# Patient Record
Sex: Female | Born: 1985 | Race: White | Hispanic: Yes | Marital: Single | Smoking: Former smoker
Health system: Southern US, Community
[De-identification: ages and names within clinical notes are randomized; demographics above are authoritative.]

## PROBLEM LIST (undated history)

## (undated) DIAGNOSIS — R519 Headache, unspecified: Secondary | ICD-10-CM

## (undated) DIAGNOSIS — Z227 Latent tuberculosis: Secondary | ICD-10-CM

## (undated) DIAGNOSIS — R635 Abnormal weight gain: Secondary | ICD-10-CM

## (undated) DIAGNOSIS — N63 Unspecified lump in unspecified breast: Secondary | ICD-10-CM

## (undated) DIAGNOSIS — Z9289 Personal history of other medical treatment: Secondary | ICD-10-CM

## (undated) DIAGNOSIS — D649 Anemia, unspecified: Secondary | ICD-10-CM

## (undated) DIAGNOSIS — Z862 Personal history of diseases of the blood and blood-forming organs and certain disorders involving the immune mechanism: Secondary | ICD-10-CM

## (undated) HISTORY — DX: Headache, unspecified: R51.9

## (undated) HISTORY — PX: OTHER SURGICAL HISTORY: SHX169

## (undated) HISTORY — DX: Abnormal weight gain: R63.5

## (undated) HISTORY — DX: Personal history of diseases of the blood and blood-forming organs and certain disorders involving the immune mechanism: Z86.2

## (undated) HISTORY — DX: Unspecified lump in unspecified breast: N63.0

## (undated) HISTORY — DX: Latent tuberculosis: Z22.7

## (undated) HISTORY — DX: Personal history of other medical treatment: Z92.89

## (undated) HISTORY — DX: Anemia, unspecified: D64.9

---

## 2013-01-09 ENCOUNTER — Ambulatory Visit: Payer: Self-pay | Admitting: Family Medicine

## 2013-01-09 DIAGNOSIS — R7611 Nonspecific reaction to tuberculin skin test without active tuberculosis: Secondary | ICD-10-CM | POA: Insufficient documentation

## 2013-01-13 ENCOUNTER — Encounter: Payer: Self-pay | Admitting: Obstetrics and Gynecology

## 2013-04-30 ENCOUNTER — Observation Stay: Payer: Self-pay

## 2013-05-07 ENCOUNTER — Observation Stay: Payer: Self-pay

## 2013-05-08 ENCOUNTER — Encounter: Payer: Self-pay | Admitting: Obstetrics and Gynecology

## 2013-05-08 LAB — PROTEIN / CREATININE RATIO, URINE: Protein, Random Urine: 18 mg/dL — ABNORMAL HIGH (ref 0–12)

## 2013-05-09 ENCOUNTER — Inpatient Hospital Stay: Payer: Self-pay | Admitting: Obstetrics and Gynecology

## 2013-05-09 LAB — PROTEIN / CREATININE RATIO, URINE
Creatinine, Urine: 54.4 mg/dL
Protein, Random Urine: 39 mg/dL — ABNORMAL HIGH
Protein/Creat. Ratio: 717 mg/g{creat} — ABNORMAL HIGH

## 2013-05-09 LAB — PIH PROFILE
Anion Gap: 6 — ABNORMAL LOW (ref 7–16)
BUN: 11 mg/dL (ref 7–18)
Calcium, Total: 9.1 mg/dL (ref 8.5–10.1)
Calcium, Total: 9.6 mg/dL (ref 8.5–10.1)
Chloride: 107 mmol/L (ref 98–107)
Chloride: 107 mmol/L (ref 98–107)
EGFR (African American): >60
EGFR (African American): >60
Glucose: 81 mg/dL (ref 65–99)
HCT: 34.7 % — ABNORMAL LOW (ref 35.0–47.0)
HGB: 11.5 g/dL — ABNORMAL LOW (ref 12.0–16.0)
MCH: 26.2 pg (ref 26.0–34.0)
MCH: 26.4 pg (ref 26.0–34.0)
MCV: 79 fL — ABNORMAL LOW (ref 80–100)
Potassium: 4.6 mmol/L (ref 3.5–5.1)
Potassium: 4.6 mmol/L (ref 3.5–5.1)
RDW: 16.7 % — ABNORMAL HIGH (ref 11.5–14.5)
RDW: 17.3 % — ABNORMAL HIGH (ref 11.5–14.5)
Sodium: 136 mmol/L (ref 136–145)
Uric Acid: 8.6 mg/dL — ABNORMAL HIGH (ref 2.6–6.0)
Uric Acid: 8.6 mg/dL — ABNORMAL HIGH (ref 2.6–6.0)
WBC: 17.7 10*3/uL — ABNORMAL HIGH (ref 3.6–11.0)
WBC: 31 10*3/uL — ABNORMAL HIGH (ref 3.6–11.0)

## 2013-05-09 LAB — MAGNESIUM: Magnesium: 8.8 mg/dL — ABNORMAL HIGH

## 2013-05-10 LAB — CBC WITH DIFFERENTIAL/PLATELET
Basophil #: 0 10*3/uL (ref 0.0–0.1)
Basophil %: 0.2 %
Eosinophil #: 0.1 10*3/uL (ref 0.0–0.7)
Eosinophil %: 0.4 %
HCT: 23.3 % — ABNORMAL LOW (ref 35.0–47.0)
Lymphocyte #: 3.5 10*3/uL (ref 1.0–3.6)
Lymphocyte %: 15.2 %
MCV: 80 fL (ref 80–100)
Monocyte %: 6.3 %
Neutrophil #: 18.1 10*3/uL — ABNORMAL HIGH (ref 1.4–6.5)
Neutrophil %: 77.9 %
RDW: 17 % — ABNORMAL HIGH (ref 11.5–14.5)
WBC: 23.3 10*3/uL — ABNORMAL HIGH (ref 3.6–11.0)

## 2013-05-10 LAB — HEMOGLOBIN: HGB: 7.6 g/dL — ABNORMAL LOW (ref 12.0–16.0)

## 2013-05-11 LAB — CBC WITH DIFFERENTIAL/PLATELET
HCT: 26.8 % — ABNORMAL LOW (ref 35.0–47.0)
Lymphocytes: 16 %
MCH: 26.8 pg (ref 26.0–34.0)
MCHC: 33.9 g/dL (ref 32.0–36.0)
MCV: 79 fL — ABNORMAL LOW (ref 80–100)
NRBC/100 WBC: 1 /
Platelet: 116 10*3/uL — ABNORMAL LOW (ref 150–440)
RBC: 3.39 10*6/uL — ABNORMAL LOW (ref 3.80–5.20)
Variant Lymphocyte - H1-Rlymph: 1 %
WBC: 19.8 10*3/uL — ABNORMAL HIGH (ref 3.6–11.0)

## 2014-12-25 NOTE — Consult Note (Signed)
Referral Information:  Reason for Referral History of IUFD at "8 months" due to preeclampsia in 2005 in [place], currently 37+ weeks pregnant.  Presents for recommendations.   Referring Physician [facility]   Prenatal Hx Complicated only by UTIs and positive PPD with negative CXR.  Baseline preeclampsia labs were normal.  Patient has had an ultrasound and had genetic counseling this pregnancy, and has been on a baby ASA.   Past Obstetrical Hx 2005 NVD of stillborn female, 7#, complicated by preeclampsia and club foot in [place] (different FOB).  She presented to the hospital with complaints of contractions and was told that the baby had died; she was subsequently induced.  Pregnancy was otherwise uncomplicated per patient although she was told to "watch her salt intake".  Postpartum course was also uncomplicated. 2014 Current   Home Medications: Medication Instructions Status  Prenatal Multivitamins Prenatal Multivitamins oral tablet 1 tab(s) orally once a day Active  Aspir 81 81 mg oral tablet 1 tab(s) orally once a day Active   Allergies:   No Known Allergies:   Vital Signs/Notes:  Nursing Vital Signs:  **Vital Signs.:   04-Sep-14 14:16  Vital Signs Type Routine  Temperature Temperature (F) 98.7  Celsius 37  Temperature Source oral  Pulse Pulse 103  Respirations Respirations 16  Systolic BP Systolic BP 355  Diastolic BP (mmHg) Diastolic BP (mmHg) 66  Mean BP 93  BP Source  if not from Vital Sign Device non-invasive    15:00  Vital Signs Type Recheck  Pulse Pulse 87  Systolic BP Systolic BP 732  Diastolic BP (mmHg) Diastolic BP (mmHg) 77  Mean BP 94  BP Source  if not from Vital Sign Device non-invasive   Perinatal Consult:  LMP 18-Aug-2012   Past Medical History cont'd UTI (treated with Keflext this pregnancy), negative test of cure + PPD with negative CXR - planning on postpartum medical therapy Sickle Cell trait - had genetic counseling;  per notes, FOB was tested and is negative (per patient)   PSurg Hx None   FHx Mother with diabetes, paternal first cousin with isolated cleft lip and palate   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     [place]:    04-Sep-14 14:34, MFM OB US, Follow Up, Per Fetus  MFM OB US, Follow Up, Per Fetus   Indication: Growth, H/O IUFD, H/O preeclampsia.   ____________________________________________________________________________  History: Age: 29 years.  ____________________________________________________________________________  Dating:  LMP:08/18/2012     EDC:     05/25/2013     GA by LMP:     [redacted]w[redacted]d  Earlier Assessment on:     01/13/2013     EDC:     05/18/2013     GA by earlier  assessment:     [redacted]w[redacted]d  Current Scan on:     05/08/2013     EDC:     05/21/2013     GA by current scan:      [redacted]w[redacted]d  Best Overall Assessment:     05/08/2013     EDC:     05/25/2013     Assessed GA:      [redacted]w[redacted]d  The calculation of the gestational age by current scan was based  on BPD, HC, AC,  FL and HUM.  The Best Overall Assessment is based on the LMP.  ____________________________________________________________________________  Anatomy Scan:  Singleton gestation.  Biometry:  BPD     92.5     mm      -     [redacted]w[redacted]d     ([redacted]w[redacted]d to [redacted]w[redacted]d)  HC     334.0     mm      -     [redacted]w[redacted]d     ([redacted]w[redacted]d to [redacted]w[redacted]d)  AC     334.1   mm      -     [redacted]w[redacted]d     ([redacted]w[redacted]d to [redacted]w[redacted]d)  FL     73.3     mm      -     [redacted]w[redacted]d     ([redacted]w[redacted]d to [redacted]w[redacted]d)  HUM     65.8     mm      -     [redacted]w[redacted]d  EFW (lbs/oz)     7 lbs     2 ozs  EFW (g)     3225     g     57th%     Fetal heart activity: present. Fetal heart rate: 143 bpm.   Fetal presentation: cephalic.   Cord: visualized previously.   Placenta: posterior.  No previa documented previously.     Fetal Anatomy:  Visualized with normal appearance: gastrointestinal tract, kidneys,  bladder.    Brain: visualized previously.   Face: visualized previously.   Spine: visualized previously  Heart: Suboptimally visualized.   Abdominal Wall: visualized previously.   Extremities: visualized previously.    ____________________________________________________________________________  Fetal Wellbeing Assessment:  Amniotic fluid: normal. AFI: 11.9 cm. MVP: 36.4 cm. Q1: 5.3 cm. Q3: 29.3 cm. Q4:  36.4 cm.   Non Stress Test: Fetal heart rate: 143 bpm.   Biophysical Profile: Fetal body movements: normal (2), Fetaltone: normal (2),  Fetal breathing movements: normal (2), Amniotic fluid volume: normal (2). Score  8 / 8.     ____________________________________________________________________________  Report Summary:  Impression: Single intrauterine pregnancy with an estimated gestational age of  [redacted] weeks 4 day(s).  Dating assigned by LMP concordant with ultrasound performed  at [facility] on 01/13/2013; measurements at that exam reported  as 22 weeks 1 days.  Follow up ultrasound performed to assess growth.    Due to fetal position, views of the heart were suboptimal but have been  previously visualized.  Other fetal anatomy visualized appears normal or has  been documented previously.    Appropriate interval growth.    BPP 8/8. CODING DESCRIPTION:  History of preeclampsia.  History of IUFD.    Recommendations:     Thank you for allowing [place] to participate in her care.  Electronically signed by:  Wynona Neat, MD   Impression/Recommendations:  Impression 29yo G2P1000 at with history of stillbirth ? due to preeclampsia at "8 months".  BP initially elevated (140's/60's) but was 128/77 on repeat.  Currently without signs or symptoms of preeclampsia.  Has had HAs but they resolve on their own and she describes them as mild.  No persistent  swelling and no RUQ pain or visual changes.   Recommendations 1.  Patient already getting 2x weekly NSTs at the birthing center.  Continue until delivery. 2.  Weekly visits to monitor BPs - discussed signs and symptoms of preeclampsia as well as counseled on kick counts. 3.  Consider induction at 39-40 weeks unless clinically indicated  earlier. 4.  Postpartum treatment for TB. 5.  P/C ratio ordered today as I didn't see this in any of her labs (Riley labs done earlier included cbc and LFTs which were normal).    Total Time Spent with Patient 15 minutes   >50% of visit spent in couseling/coordination of care yes   Office Use Only 99241  Level 1 (63min) NEW office consult prob focused   Coding Description: MATERNAL CONDITIONS/HISTORY INDICATION(S).   Previous stillborn or neonatal death - Poor Reproductive History.  Electronic Signatures: Wynona Neat (MD)  (Signed 04-Sep-14 15:52)  Authored: Referral, Home Medications, Allergies, Vital Signs/Notes, Consult, Exam, Radiology, Impression, Billing, Coding Description   Last Updated: 04-Sep-14 15:52 by Wynona Neat (MD)

## 2015-01-12 NOTE — H&P (Signed)
L&D Evaluation:  History:  HPI 29 yo G2P0010 with LMP of 08/18/12 & EDD of 05/25/13 with [place] at [facility] here for NST due to hx of IUFD due to pre-ecclampsia in [place] with at bedtime of sickle cell trait, +PPD with neg chest xray, [facility] of cleft lip, lupus, IUFD with a 2nd cousin. Pt is on ASA 81 mg daily.   Presents with NST   Patient's Medical History IUFD hx, +PPD with neg xray   Patient's Surgical History none   Medications Pre Natal Vitamins   Allergies NKDA   Social History none   Family History Non-Contributory   ROS:  ROS All systems were reviewed.  HEENT, CNS, GI, GU, Respiratory, CV, Renal and Musculoskeletal systems were found to be normal.   Exam:  Vital Signs stable   General no apparent distress   Mental Status clear   Chest clear   Heart normal sinus rhythm, no murmur/gallop/rubs   Abdomen gravid, non-tender   Estimated Fetal Weight Average for gestational age   Back no CVAT   Reflexes 1+   Pelvic not evaluated   Mebranes Intact   FHT normal rate with no decels, reactive NST with 2 accels 15 x 15 bpm   Ucx absent   Skin dry   Lymph no lymphadenopathy   Impression:  Impression IUP at 37 weeks with reactive NST   Plan:  Plan discharge   Electronic Signatures: Catheryn Bacon (CNM)  (Signed 03-Sep-14 09:32)  Authored: L&D Evaluation   Last Updated: 03-Sep-14 09:32 by Catheryn Bacon (CNM)

## 2015-01-12 NOTE — H&P (Signed)
L&D Evaluation:  History:  HPI 29 yo G2P0010 with PNC at [facility] significant for hx of IUFD at term with [place]cl[place] in [place], On ASA 81 mg now, Sickle cell trait with ?testing on FOB, +PPD with neg ches for TB, UTI, [facility] of cleft lip, lupus, IUFD's. +FH of DM (mom,parents). No VB,UC's,ROM or decreased FM. LMP of 08/18/12 & EDD of 05/25/13.   Presents with NST   Patient's Medical History +PPD with neg chest xray, +IUFD at term with pre-ex   Patient's Surgical History none   Medications Pre Natal Vitamins   Allergies NKDA   Social History none   Family History Non-Contributory   ROS:  ROS All systems were reviewed.  HEENT, CNS, GI, GU, Respiratory, CV, Renal and Musculoskeletal systems were found to be normal.   Exam:  Vital Signs stable   General no apparent distress   Mental Status clear   Chest clear   Heart normal sinus rhythm, no murmur/gallop/rubs   Abdomen gravid, non-tender   Estimated Fetal Weight Average for gestational age   Back no CVAT   Pelvic not eval   Mebranes Intact   FHT normal rate with no decels, reacttive NST with 2 accels 15 x 15   Ucx irregular   Skin dry   Lymph no lymphadenopathy   Impression:  Impression IUP at 36 3/7 weeks   Plan:  Plan NST 2 x a week   Electronic Signatures: Catheryn Bacon (CNM)  (Signed 27-Aug-14 09:20)  Authored: L&D Evaluation   Last Updated: 27-Aug-14 09:20 by Catheryn Bacon (CNM)

## 2016-07-18 DIAGNOSIS — D242 Benign neoplasm of left breast: Secondary | ICD-10-CM | POA: Insufficient documentation

## 2016-07-18 DIAGNOSIS — N6459 Other signs and symptoms in breast: Secondary | ICD-10-CM | POA: Insufficient documentation

## 2018-07-31 LAB — HM PAP SMEAR: HM Pap smear: NEGATIVE

## 2018-07-31 LAB — HM HIV SCREENING LAB: HM HIV Screening: NEGATIVE

## 2019-05-19 DIAGNOSIS — N6459 Other signs and symptoms in breast: Secondary | ICD-10-CM

## 2019-05-19 DIAGNOSIS — R7611 Nonspecific reaction to tuberculin skin test without active tuberculosis: Secondary | ICD-10-CM

## 2019-05-20 ENCOUNTER — Other Ambulatory Visit: Payer: Self-pay

## 2019-05-20 ENCOUNTER — Ambulatory Visit (LOCAL_COMMUNITY_HEALTH_CENTER): Payer: Self-pay

## 2019-05-20 VITALS — BP 115/66 | Ht 64.5 in | Wt 150.0 lb

## 2019-05-20 DIAGNOSIS — Z30011 Encounter for initial prescription of contraceptive pills: Secondary | ICD-10-CM

## 2019-05-20 DIAGNOSIS — Z3009 Encounter for other general counseling and advice on contraception: Secondary | ICD-10-CM

## 2019-05-20 DIAGNOSIS — Z3041 Encounter for surveillance of contraceptive pills: Secondary | ICD-10-CM

## 2019-05-20 MED ORDER — NORGESTIMATE-ETH ESTRADIOL 0.25-35 MG-MCG PO TABS: 1.0000 | ORAL_TABLET | Freq: Every day | ORAL | 0 refills | Status: DC

## 2019-05-20 NOTE — Progress Notes (Signed)
Last physical at [facility] 07/31/2018; #13 packs of Sprintec ordered and dispensed that same day. No problems with OCP. Pt states she has been continuous dosing with Sprintec to have 4 periods per year. Consulted with provider regarding pt request for OCP supply to continue with Sprintec continuous dosing; per verbal by Hassell Done, FNP, dispensed #3 packs of Sprintec today. Pt to call when she opens her last pack to schedule physical.

## 2019-07-22 ENCOUNTER — Encounter: Payer: Self-pay | Admitting: Family Medicine

## 2019-07-22 ENCOUNTER — Ambulatory Visit (LOCAL_COMMUNITY_HEALTH_CENTER): Payer: Self-pay | Admitting: Family Medicine

## 2019-07-22 ENCOUNTER — Other Ambulatory Visit: Payer: Self-pay

## 2019-07-22 VITALS — BP 126/65 | Ht 64.0 in | Wt 153.0 lb

## 2019-07-22 DIAGNOSIS — Z3009 Encounter for other general counseling and advice on contraception: Secondary | ICD-10-CM

## 2019-07-22 DIAGNOSIS — Z30011 Encounter for initial prescription of contraceptive pills: Secondary | ICD-10-CM

## 2019-07-22 MED ORDER — NORGESTIMATE-ETH ESTRADIOL 0.25-35 MG-MCG PO TABS: 1.0000 | ORAL_TABLET | Freq: Every day | ORAL | 10 refills | Status: DC

## 2019-07-22 NOTE — Progress Notes (Signed)
Provider orders completed. 

## 2019-07-22 NOTE — Progress Notes (Signed)
  Family Planning Visit- Repeat Yearly Visit  Subjective:  Claudia Guerra is a 33 y.o. being seen today for an well woman visit and to discuss family planning options.    She is currently using OCP (estrogen/progesterone) for pregnancy prevention. Patient reports she does not if she or her partner wants a pregnancy in the next year. Patient  has Abnormal breast finding and PPD positive on their problem list.  Chief Complaint  Patient presents with  . Contraception    OCP supply    Patient reports here for refill on Sprintec.  She has not complaints or concerns.  Denies changes in health in the past year.   Does the patient desire a pregnancy in the next year? (OKQ flowsheet)no  See flowsheet for other program required questions.   Body mass index is 26.26 kg/m. - Patient is eligible for diabetes screening based on BMI and age [id number]?  not applicable [id number] ordered? not applicable  Patient reports 2 of partners in last year. Desires STI screening?  No  Does the patient have a current or past history of drug use? No   No components found for: HCV]  Health Maintenance Due  Topic Date Due  . TETANUS/TDAP  02/10/2005  . INFLUENZA VACCINE  04/05/2019    ROS  The following portions of the patient's history were reviewed and updated as appropriate: allergies, current medications, past family history, past medical history, past social history, past surgical history and problem list. Problem list updated.  Objective:   Vitals:   07/22/19 1631  BP: 126/65  Weight: 153 lb (69.4 kg)  Height: 5\' 4"  (1.626 m)    Physical Exam Chest:     Breasts:        Right: Normal. No skin change or tenderness.        Left: Normal. No skin change or tenderness.   Pelvic not indicated.   Assessment and Plan:  Claudia Guerra is a 33 y.o. female presenting to the [facility] Department for an initial well woman exam/family planning visit  Contraception counseling:  Reviewed all forms of birth control options available including abstinence; over the counter/barrier methods; hormonal contraceptive medication including pill, patch, ring, injection,contraceptive implant; hormonal and nonhormonal IUDs; permanent sterilization options including vasectomy and the various tubal sterilization modalities. Risks and benefits reviewed.  Questions were answered.  Written information was also given to the patient to review.  Patient desires Sprintec, this was prescribed for patient. She will follow up in  1 year for surveillance.  She was told to call with any further questions, or with any concerns about this method of contraception.  Emphasized use of condoms 100% of the time for STI prevention.  1. General counseling and advice on contraceptive management  2. OCP (oral contraceptive pills) initiation Ortho Cyclen take 1 active tablet x 12 weeks then 1 week of inactive tablets on 13th week. Disp #16 Pap smear due 2022   No follow-ups on file.  No future appointments.  Hassell Done, FNP

## 2019-07-22 NOTE — Progress Notes (Signed)
Pt states her sexual desire has been diminished for about 4 months. Pt states she has been on these pills for about 2 years.

## 2019-11-21 ENCOUNTER — Ambulatory Visit: Payer: Self-pay | Attending: Internal Medicine

## 2019-11-21 DIAGNOSIS — Z23 Encounter for immunization: Secondary | ICD-10-CM

## 2019-11-21 NOTE — Progress Notes (Signed)
   Covid-19 Vaccination Clinic  Name:  Simon Devillers    MRN: [id number] DOB: [birth year]  11/21/2019  Ms. Josiah Poellnitz was observed post Covid-19 immunization for 15 minutes without incident. She was provided with Vaccine Information Sheet and instruction to access the V-Safe system.   Ms. Alisen Fredrickson was instructed to call 911 with any severe reactions post vaccine: Marland Kitchen Difficulty breathing  . Swelling of face and throat  . A fast heartbeat  . A bad rash all over body  . Dizziness and weakness   Immunizations Administered    Name Date Dose VIS Date Route   Pfizer COVID-19 Vaccine 11/21/2019  6:04 PM 0.3 mL 08/15/2019 Intramuscular   Manufacturer: [place]   Lot: [id number]   [place]: [id number]

## 2019-12-10 ENCOUNTER — Other Ambulatory Visit: Payer: Self-pay

## 2019-12-10 ENCOUNTER — Ambulatory Visit (LOCAL_COMMUNITY_HEALTH_CENTER): Payer: Self-pay

## 2019-12-10 VITALS — BP 110/68 | Ht 64.0 in | Wt 157.0 lb

## 2019-12-10 DIAGNOSIS — Z3041 Encounter for surveillance of contraceptive pills: Secondary | ICD-10-CM

## 2019-12-10 DIAGNOSIS — Z3009 Encounter for other general counseling and advice on contraception: Secondary | ICD-10-CM

## 2019-12-10 MED ORDER — NORGESTIMATE-ETH ESTRADIOL 0.25-35 MG-MCG PO TABS: 1.0000 | ORAL_TABLET | Freq: Every day | ORAL | 0 refills | Status: DC

## 2019-12-10 NOTE — Progress Notes (Signed)
Language Line used. Pt to clinic for OCP supply, reports no problems. #8 packs of Sprintec dispensed per Hassell Done, FNP order dated 07/22/2019 (unable able to give all 10 to complete order due to expire date/qty available).

## 2019-12-12 ENCOUNTER — Ambulatory Visit: Payer: Self-pay | Attending: Internal Medicine

## 2019-12-12 DIAGNOSIS — Z23 Encounter for immunization: Secondary | ICD-10-CM

## 2019-12-12 NOTE — Progress Notes (Signed)
   Covid-19 Vaccination Clinic  Name:  Claudia Guerra    MRN: [id number] DOB: Mar 30, 1986  12/12/2019  [name] was observed post Covid-19 immunization for 15 minutes without incident. She was provided with Vaccine Information Sheet and instruction to access the V-Safe system. Medical Interpreter used.  [name] was instructed to call 911 with any severe reactions post vaccine: Marland Kitchen Difficulty breathing  . Swelling of face and throat  . A fast heartbeat  . A bad rash all over body  . Dizziness and weakness   Immunizations Administered    Name Date Dose VIS Date Route   Pfizer COVID-19 Vaccine 12/12/2019  6:24 PM 0.3 mL 08/15/2019 Intramuscular   Manufacturer: [place]   Lot: [id number]   [place]: [id number]

## 2020-05-31 ENCOUNTER — Other Ambulatory Visit: Payer: Self-pay

## 2020-05-31 ENCOUNTER — Ambulatory Visit (LOCAL_COMMUNITY_HEALTH_CENTER): Payer: Self-pay

## 2020-05-31 VITALS — BP 117/73 | Ht 64.0 in | Wt 157.5 lb

## 2020-05-31 DIAGNOSIS — Z3009 Encounter for other general counseling and advice on contraception: Secondary | ICD-10-CM

## 2020-05-31 DIAGNOSIS — Z3041 Encounter for surveillance of contraceptive pills: Secondary | ICD-10-CM

## 2020-05-31 DIAGNOSIS — Z30011 Encounter for initial prescription of contraceptive pills: Secondary | ICD-10-CM

## 2020-05-31 MED ORDER — NORGESTIMATE-ETH ESTRADIOL 0.25-35 MG-MCG PO TABS: 1.0000 | ORAL_TABLET | Freq: Every day | ORAL | 2 refills | Status: DC

## 2020-05-31 NOTE — Progress Notes (Signed)
Hassell Done, FNP order dated 07/22/2019 completed; dispensed the final #2 packs of Sprintec to complete this order.

## 2020-07-20 ENCOUNTER — Other Ambulatory Visit: Payer: Self-pay

## 2020-07-20 ENCOUNTER — Ambulatory Visit (LOCAL_COMMUNITY_HEALTH_CENTER): Payer: Self-pay | Admitting: Family Medicine

## 2020-07-20 ENCOUNTER — Encounter: Payer: Self-pay | Admitting: Advanced Practice Midwife

## 2020-07-20 VITALS — BP 121/71 | HR 89 | Temp 98.8°F | Ht 64.0 in | Wt 160.8 lb

## 2020-07-20 DIAGNOSIS — N63 Unspecified lump in unspecified breast: Secondary | ICD-10-CM

## 2020-07-20 DIAGNOSIS — R519 Headache, unspecified: Secondary | ICD-10-CM

## 2020-07-20 DIAGNOSIS — Z30011 Encounter for initial prescription of contraceptive pills: Secondary | ICD-10-CM

## 2020-07-20 DIAGNOSIS — Z3009 Encounter for other general counseling and advice on contraception: Secondary | ICD-10-CM

## 2020-07-20 MED ORDER — NORGESTIMATE-ETH ESTRADIOL 0.25-35 MG-MCG PO TABS: 1.0000 | ORAL_TABLET | Freq: Every day | ORAL | 16 refills | Status: DC

## 2020-07-20 NOTE — Progress Notes (Signed)
Client unsure of exact LMP due to continuous dosing of ocps. Reports menses every 4 months. Per client, received influenza vaccine end of 05/2020 at [facility]. Covid vaccine series completed. Last PAP in 2019 and results in outguide for provider review. Client currently on week 4, day 6 of last pack of ocps. Rich Number, RN  Client dispensed 12 packs of Sprintec and to return for last 4 packs of ocps when begins last pack. Rich Number, RN

## 2020-07-20 NOTE — Progress Notes (Signed)
Family Planning Visit- Repeat Yearly Visit  Subjective:  Claudia Guerra is a 34 y.o. G1P1001  being seen today for an well woman visit and to discuss family planning options.    She is currently using Combination OCPs for pregnancy prevention. Patient reports she does not if she or her partner wants a pregnancy in the next year. Patient  has Abnormal breast finding and PPD positive on their problem list.  Chief Complaint  Patient presents with  . Annual Exam  . Contraception    Desires to continue ocps    Patient reports having a history of headaches about 2 per month, breast lump  Patient denies s/sx or  need for STI testing today  See flowsheet for other program required questions.   Body mass index is 27.6 kg/m. - Patient is eligible for diabetes screening based on BMI and age >24?  no HA1C ordered? not applicable  Patient reports 1 of partners in last year. Desires STI screening?  No - denies need with provider    Has patient been screened once for HCV in the past?  No  No results found for: HCVAB  Does the patient have current of drug use, have a partner with drug use, and/or has been incarcerated since last result? No  If yes-- Screen for HCV through [facility]   Does the patient meet criteria for HBV testing? No  Criteria:  -Household, sexual or needle sharing contact with HBV -History of drug use -HIV positive -Those with known Hep C   Health Maintenance Due  Topic Date Due  . Hepatitis C Screening  Never done  . TETANUS/TDAP  Never done  . INFLUENZA VACCINE  Never done    Review of Systems  Neurological: Positive for headaches.  All other systems reviewed and are negative.   The following portions of the patient's history were reviewed and updated as appropriate: allergies, current medications, past family history, past medical history, past social history, past surgical history and problem list. Problem list updated.  Objective:   Vitals:    07/20/20 1018  BP: 121/71  Pulse: 89  Temp: 98.8 F (37.1 C)  Weight: 160 lb 12.8 oz (72.9 kg)  Height: 5\' 4"  (1.626 m)    Physical Exam Vitals and nursing note reviewed.  Constitutional:      Appearance: Normal appearance.  HENT:     Head: Normocephalic.     Comments: In scalp, brows and lashes: no nits, no hair loss     Mouth/Throat:     Mouth: Mucous membranes are moist.     Pharynx: Oropharynx is clear. No oropharyngeal exudate or posterior oropharyngeal erythema.  Cardiovascular:     Rate and Rhythm: Normal rate and regular rhythm.     Pulses: Normal pulses.     Heart sounds: Normal heart sounds.  Pulmonary:     Effort: Pulmonary effort is normal.     Breath sounds: Normal breath sounds.  Chest:    Abdominal:     General: Abdomen is flat.     Palpations: Abdomen is soft. There is no mass.     Tenderness: There is no abdominal tenderness. There is no guarding.     Hernia: No hernia is present.  Genitourinary:    Comments: Pelvic deferred Musculoskeletal:     Cervical back: Normal range of motion.  Lymphadenopathy:     Cervical: No cervical adenopathy.  Skin:    General: Skin is warm and dry.     Findings:  No bruising, erythema, lesion or rash.  Neurological:     Mental Status: She is alert and oriented to person, place, and time.  Psychiatric:        Mood and Affect: Mood normal.        Behavior: Behavior normal.       Assessment and Plan:  Claudia Guerra is a 34 y.o. female G1P1001 presenting to the [facility] Department for an yearly well woman exam/family planning visit  Contraception counseling: Reviewed all forms of birth control options in the tiered based approach. available including abstinence; over the counter/barrier methods; hormonal contraceptive medication including pill, patch, ring, injection,contraceptive implant, ECP; hormonal and nonhormonal IUDs; permanent sterilization options including vasectomy and the various  tubal sterilization modalities. Risks, benefits, and typical effectiveness rates were reviewed.  Questions were answered.  Written information was also given to the patient to review.  Patient desires to continue with OCP's, this was prescribed for patient. She will follow up in 1 year or as needed for surveillance.  She was told to call with any further questions, or with any concerns about this method of contraception.  Emphasized use of condoms 100% of the time for STI prevention.  Patient was not offered ECP. ECP was not accepted by the patient. ECP counseling was not given - patient has been on continuous dosing OCPs.     1. Family planning counseling  Patient is satisfied with current birth control method and wants to continue on the [place].   Patient to continue use condoms to protect from [facility], denies the need for STI testing today.     2. General counseling and advice on contraceptive management  Patient has been taking for 4 year and want to continue with pills.  Patient takes continuous dosing - norgestimate-ethinyl estradiol (SPRINTEC 28) 0.25-35 MG-MCG tablet; Take 1 tablet by mouth daily. Take as continuous dosage  Dispense: 28 tablet; Refill: 16   3. Generalized headaches  Patient reports having headaches, anout 2-3 per month. patient reports 6/10 on pain scale that are described as pressure.  Patient reports that HA are relieved by taking Excedrin. She denies n/v, ringing in ears or light or sound sensitivity.   Patient reports drinking 4-5 cups of coffee a day and 2 cups of water.  Discussed with patient to decrease caffeine and increase water intake.  Encourage patient to keep a HA journal to see how often and type of HA.  Patient to seek PCP to see for continuation of HA.    4. Breast lump  right breast ~2 cm in diameter, firm, moveable, not painful, patient reports burning sensation when time for cycle.  Referral sent to [facility] for further evaluation and management      Return in about 1 year (around 07/20/2021) for annual well woman exam or as needed.  No future appointments.  Junious Dresser, FNP

## 2020-08-04 ENCOUNTER — Ambulatory Visit: Payer: Self-pay | Attending: Oncology

## 2020-08-04 ENCOUNTER — Ambulatory Visit
Admission: RE | Admit: 2020-08-04 | Discharge: 2020-08-04 | Disposition: A | Discharge status: Home / Self Care | Payer: Self-pay | Source: Ambulatory Visit | Attending: Oncology | Admitting: Oncology

## 2020-08-04 ENCOUNTER — Other Ambulatory Visit: Payer: Self-pay

## 2020-08-04 DIAGNOSIS — N63 Unspecified lump in unspecified breast: Secondary | ICD-10-CM

## 2020-08-04 NOTE — Progress Notes (Signed)
  Subjective:     Patient ID: Claudia Guerra, female   DOB: 1986-02-12, 34 y.o.   MRN: [id number]  HPI   Review of Systems     Objective:   Physical Exam Chest:     Breasts:        Right: No swelling, bleeding, inverted nipple, mass, nipple discharge, skin change or tenderness.        Left: Mass and tenderness present. No swelling, bleeding, inverted nipple, nipple discharge or skin change.       Comments: 1 cm mass left breast 2 o'clock; intermittent cyclical left breast tenderness       Assessment:     34 year old Hispanic patient presents for [place] clinic  Visit.  Complains of left breast mass x 2 years.  Patient screened, and meets BCCCP eligibility.  Patient does not have insurance, Medicare or Medicaid. Instructed patient on breast self awareness using teach back method.  Palpated 1 cm firm mobile breast mass left breast 2 o'clock.  No other findings.    Plan:     Sent for bilateral diagnostic mammogram, and ultrasound.

## 2020-08-09 DIAGNOSIS — N63 Unspecified lump in unspecified breast: Secondary | ICD-10-CM

## 2020-08-09 NOTE — Progress Notes (Signed)
Radiologist discussed Birads 3 mammogram results with patient.  She is scheduled to return Thursday 02/03/21 at 0920 fro left breast ultrasound.  Mailed appointment information.

## 2021-02-03 ENCOUNTER — Ambulatory Visit
Admission: RE | Admit: 2021-02-03 | Discharge: 2021-02-03 | Disposition: A | Discharge status: Home / Self Care | Payer: Self-pay | Source: Ambulatory Visit | Attending: Oncology | Admitting: Oncology

## 2021-02-03 ENCOUNTER — Other Ambulatory Visit: Payer: Self-pay

## 2021-02-03 DIAGNOSIS — N63 Unspecified lump in unspecified breast: Secondary | ICD-10-CM | POA: Insufficient documentation

## 2021-02-11 ENCOUNTER — Telehealth: Payer: Self-pay | Admitting: Nurse Practitioner

## 2021-02-11 NOTE — Telephone Encounter (Signed)
Telephone call to patient regarding PAP follow-up.  PAP scheduled for 02/18/21 at 240 PM. Gregary Cromer, RN

## 2021-02-18 ENCOUNTER — Ambulatory Visit (LOCAL_COMMUNITY_HEALTH_CENTER): Payer: Self-pay | Admitting: Family Medicine

## 2021-02-18 ENCOUNTER — Other Ambulatory Visit: Payer: Self-pay

## 2021-02-18 VITALS — BP 110/71 | Ht 64.0 in | Wt 159.8 lb

## 2021-02-18 DIAGNOSIS — Z3042 Encounter for surveillance of injectable contraceptive: Secondary | ICD-10-CM

## 2021-02-18 DIAGNOSIS — Z3009 Encounter for other general counseling and advice on contraception: Secondary | ICD-10-CM

## 2021-02-18 MED ORDER — NORGESTIMATE-ETH ESTRADIOL 0.25-35 MG-MCG PO TABS: 1.0000 | ORAL_TABLET | Freq: Every day | ORAL | 4 refills | Status: DC

## 2021-02-18 NOTE — Progress Notes (Signed)
This patient was not seen by Provider.  Consulted by RN re: patient situation.  Reviewed RN note and agree that it reflects our discussion and my recommendations.  Pt not due Pap until 2024 .    Junious Dresser, FNP

## 2021-02-18 NOTE — Progress Notes (Signed)
Pt states she was called by [facility] and told she needed to come in for pap smear. Pt denies problems or concerns. Pt is requesting the remainder of her OCP order while she is here today, states [facility] unable to give her all the packs at last visit. #4 packs of Sprintec dispensed per Junious Dresser, FNP order dated 07/20/20; only #12 packs were dispensed of the #16 ordered due to expire date/qty available.

## 2021-02-24 ENCOUNTER — Encounter: Payer: Self-pay | Admitting: *Deleted

## 2021-02-24 ENCOUNTER — Other Ambulatory Visit: Payer: Self-pay | Admitting: *Deleted

## 2021-02-24 DIAGNOSIS — N63 Unspecified lump in unspecified breast: Secondary | ICD-10-CM

## 2021-02-24 NOTE — Progress Notes (Signed)
Letter mailed to inform patient of her next BCCCP and ultrasound appointment on 08/09/21 @ 8:00.

## 2021-04-16 IMAGING — MG DIGITAL DIAGNOSTIC BILAT W/ TOMO W/ CAD
6 of 10 series · 6 of 30 positions shown · non-contrast
Comparison: None.

CLINICAL DATA: 34-year-old female complaining of a palpable left
breast mass. The patient states the mass has been present for 2
years and fluctuates with her menstrual cycle but otherwise is
unchanged.

EXAM:
DIGITAL DIAGNOSTIC BILATERAL MAMMOGRAM WITH CAD AND TOMO
ULTRASOUND BILATERAL BREAST

[R MLO synth-2D]
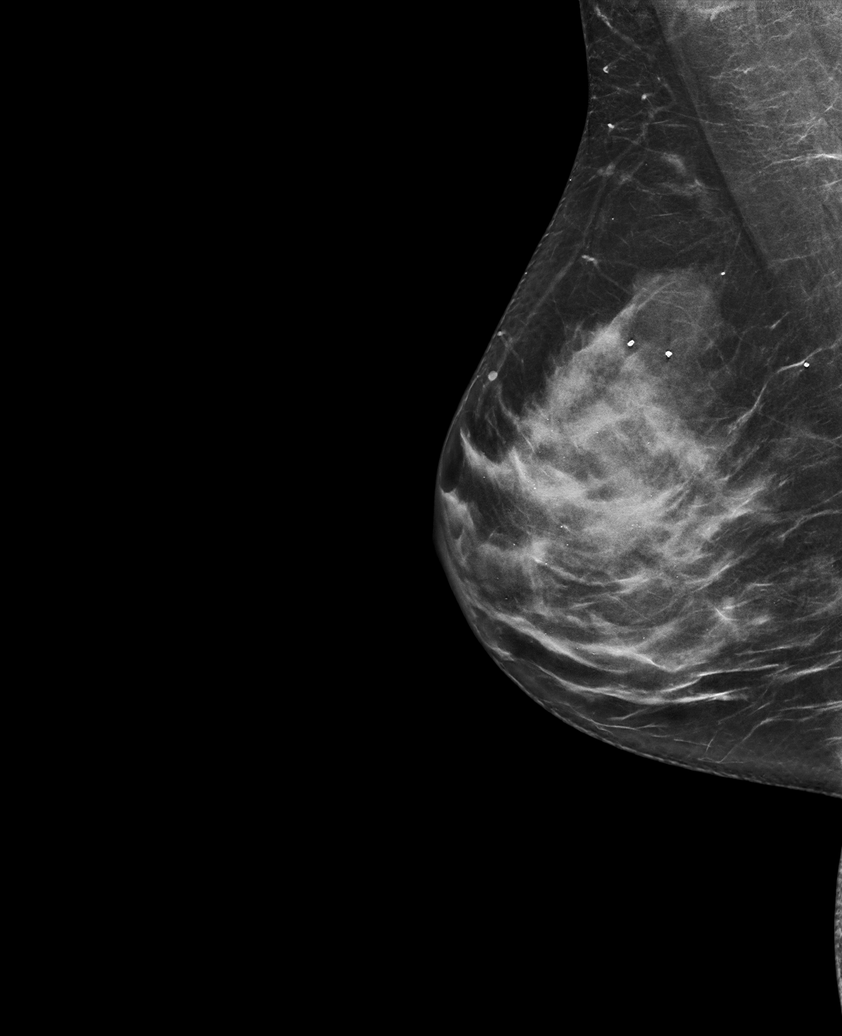

[L TAN synth-2D]
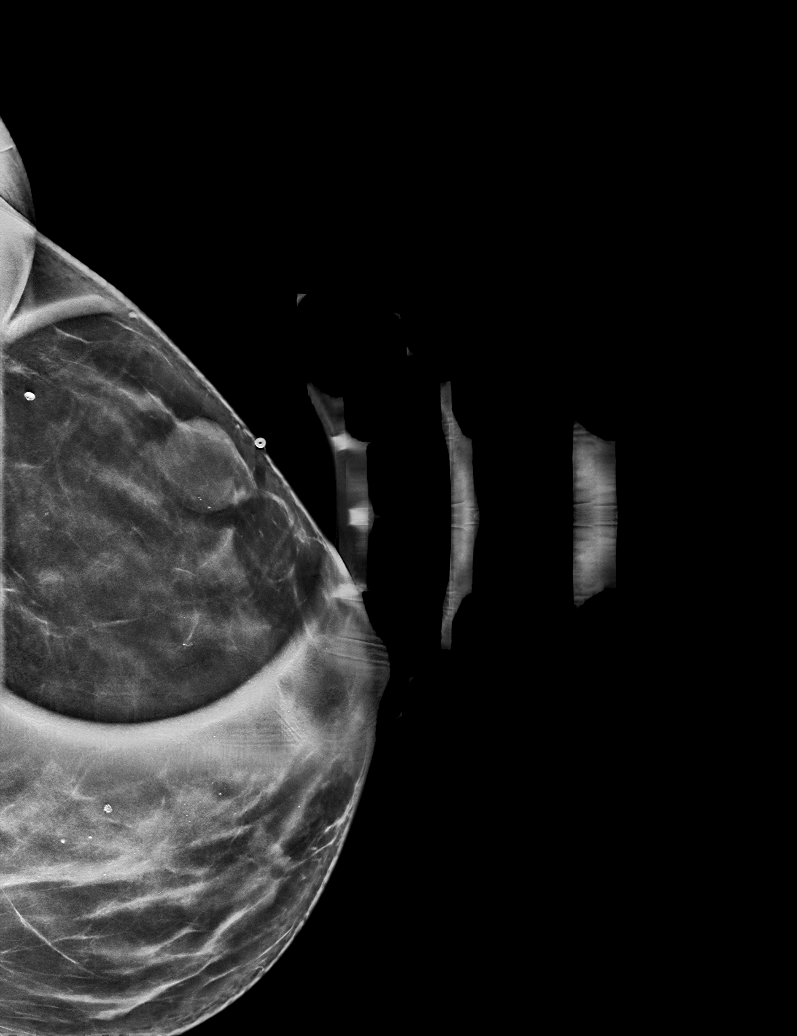

[R CC synth-2D]
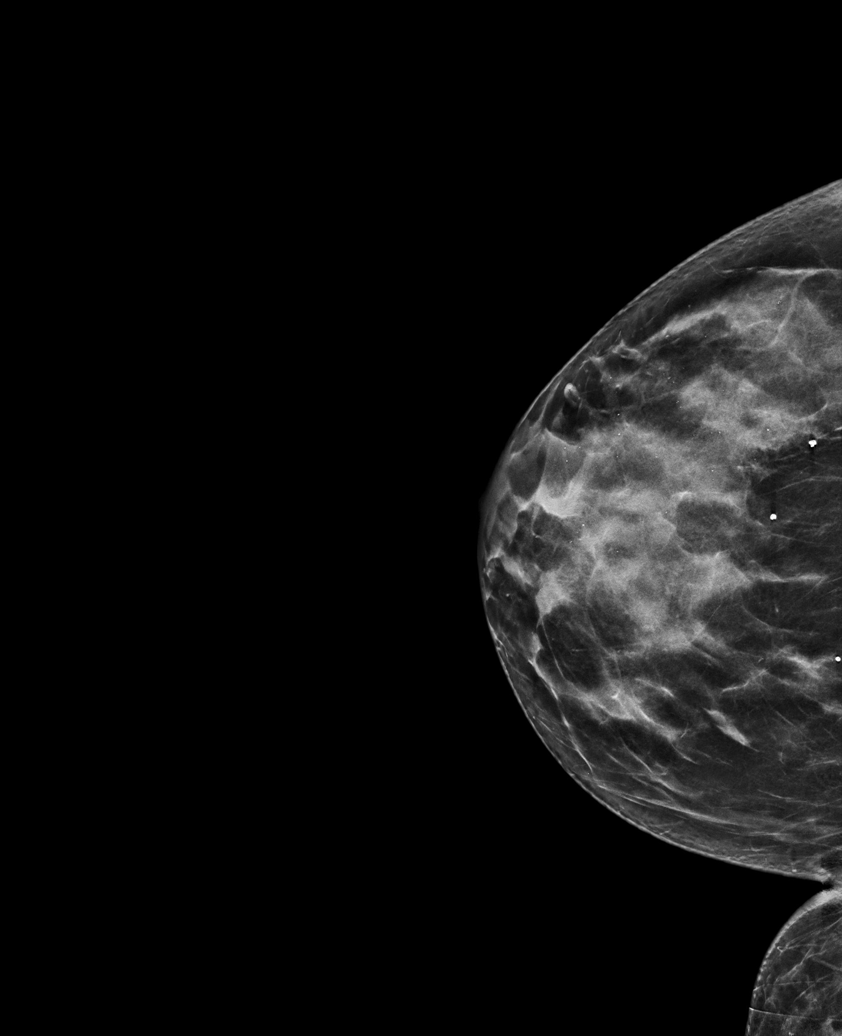

[L CC synth-2D]
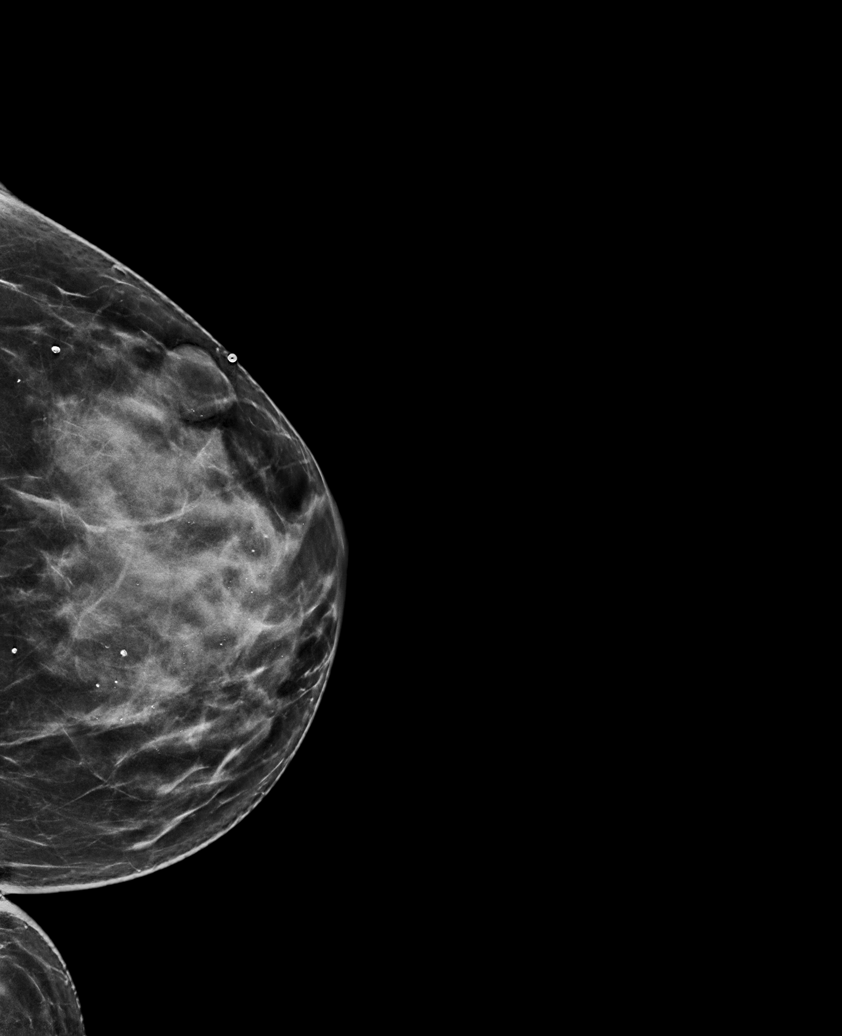

[L MLO synth-2D]
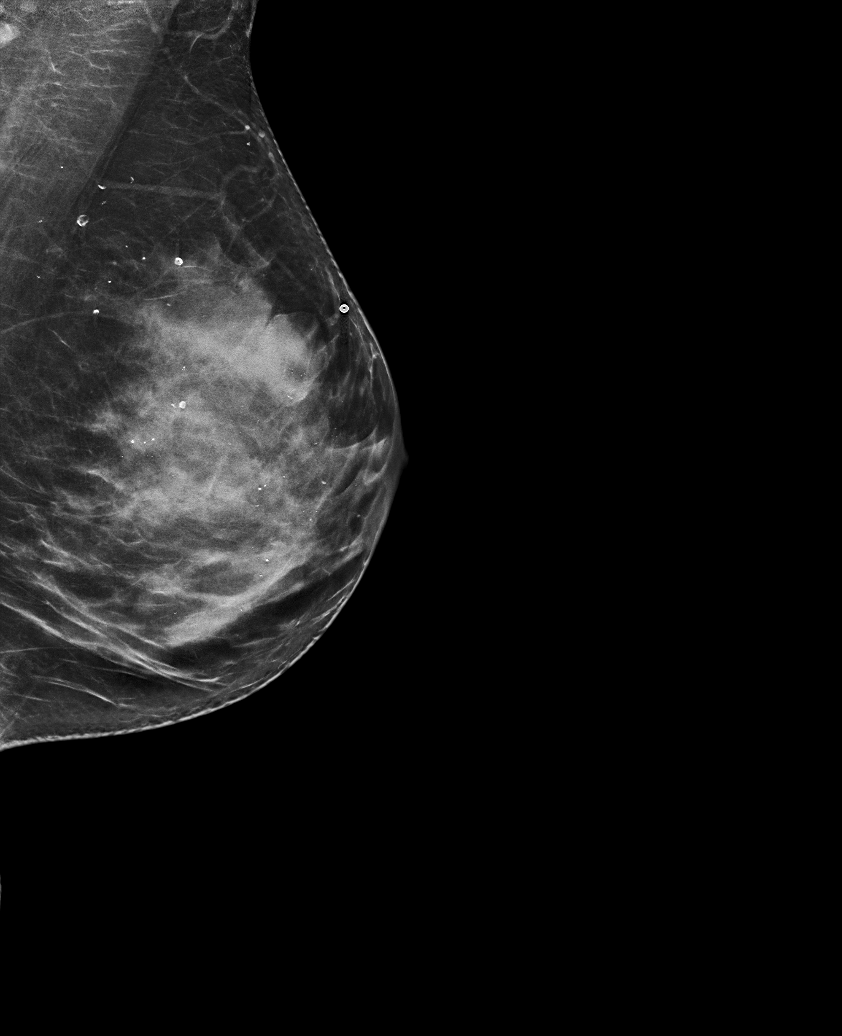

[R MLO tomo · tomo slice 35/68.0]
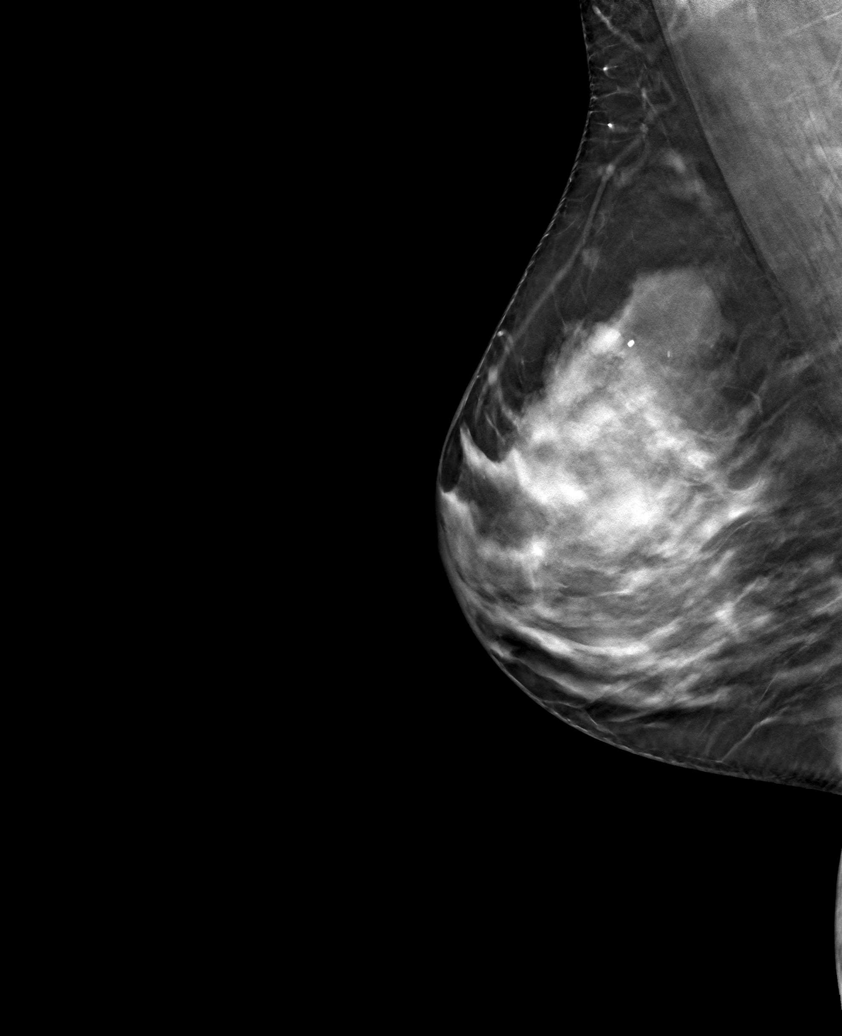

[6 of 30 positions shown; findings below may reference images not displayed]

ACR Breast Density Category d: The breast tissue is extremely dense,
which lowers the sensitivity of mammography.
FINDINGS: There is 2.3 cm mass in the upper-outer quadrant of the right breast
and a 2.3 cm mass in the upper-outer quadrant of the left breast. No
malignant type microcalcifications are seen in either breast.

Mammographic images were processed with CAD.

Targeted ultrasound is performed, showing a well-circumscribed
anechoic cyst in the right breast at 10 o'clock 6 cm from the nipple
measuring 2.3 x 1.1 x 2.4 cm. There is a well-circumscribed
hypoechoic mass with increased through transmission in the left
breast at 2 o'clock 3 cm from the nipple measuring 1.9 x 1.4 x
cm. Is thought to likely be a benign fibroadenoma.
IMPRESSION: Simple cyst in the 10 o'clock region of right breast. Probable
benign fibroadenoma in the 2 o'clock region of the left breast.

RECOMMENDATION:
Short-term interval follow-up left breast ultrasound in 6 months,
ultrasound-guided core biopsy and surgical excision of the probable
benign fibroadenoma in the left breast were discussed with the
patient. The patient feels comfortable with short-term interval
follow-up left breast ultrasound in 6 months. The importance of
self-breast examination was discussed with the patient.

I have discussed the findings and recommendations with the patient.
If applicable, a reminder letter will be sent to the patient
regarding the next appointment.

BI-RADS CATEGORY  3: Probably benign.

## 2021-08-01 ENCOUNTER — Ambulatory Visit (LOCAL_COMMUNITY_HEALTH_CENTER): Payer: Self-pay | Admitting: Family Medicine

## 2021-08-01 ENCOUNTER — Other Ambulatory Visit: Payer: Self-pay

## 2021-08-01 ENCOUNTER — Encounter: Payer: Self-pay | Admitting: Family Medicine

## 2021-08-01 VITALS — BP 129/77 | Ht 64.0 in | Wt 156.2 lb

## 2021-08-01 DIAGNOSIS — N6321 Unspecified lump in the left breast, upper outer quadrant: Secondary | ICD-10-CM

## 2021-08-01 DIAGNOSIS — Z3041 Encounter for surveillance of contraceptive pills: Secondary | ICD-10-CM

## 2021-08-01 DIAGNOSIS — Z3009 Encounter for other general counseling and advice on contraception: Secondary | ICD-10-CM

## 2021-08-01 DIAGNOSIS — Z0000 Encounter for general adult medical examination without abnormal findings: Secondary | ICD-10-CM

## 2021-08-01 MED ORDER — NORGESTIMATE-ETH ESTRADIOL 0.25-35 MG-MCG PO TABS: 1.0000 | ORAL_TABLET | Freq: Every day | ORAL | 12 refills | Status: DC

## 2021-08-01 NOTE — Progress Notes (Signed)
Patient seen for PE and BC. Condoms declined. Declines blood work. PCP and Dental list given to patient. BC pills given to patient and charted. Orders completed.

## 2021-08-01 NOTE — Progress Notes (Signed)
Family Planning Visit- Repeat Yearly Visit  Subjective:  Claudia Guerra is a 35 y.o. G1P1001  being seen today for an annual wellness visit and to discuss contraception options.   The patient is currently using Combination OCPs for pregnancy prevention. Patient does not want a pregnancy in the next year. 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  Chief Complaint  Patient presents with  . Annual Exam    contraception    Patient reports here for physical and continue OCP's   Patient denies any concerns or need for STI testing.     See flowsheet for other program required questions.   Body mass index is 26.81 kg/m. - Patient is eligible for diabetes screening based on BMI and age >66?  no HA1C ordered? no  Patient reports 1 of partners in last year. Desires STI screening?  No - declined    Has patient been screened once for HCV in the past?  No  No results found for: HCVAB  Does the patient have current of drug use, have a partner with drug use, and/or has been incarcerated since last result? No  If yes-- Screen for HCV through [facility]   Does the patient meet criteria for HBV testing? No  Criteria:  -Household, sexual or needle sharing contact with HBV -History of drug use -HIV positive -Those with known Hep C   Health Maintenance Due  Topic Date Due  . Hepatitis C Screening  Never done  . TETANUS/TDAP  Never done  . COVID-19 Vaccine (3 - Booster for Pfizer series) 02/06/2020  . INFLUENZA VACCINE  Never done    Review of Systems  Constitutional:  Negative for chills, fever, malaise/fatigue and weight loss.  HENT:  Negative for congestion, hearing loss and sore throat.   Eyes:  Negative for blurred vision, double vision and photophobia.  Respiratory:  Negative for shortness of breath.   Cardiovascular:  Negative for chest pain.  Gastrointestinal:  Negative for abdominal pain, blood  in stool, constipation, diarrhea, heartburn, nausea and vomiting.  Genitourinary:  Negative for dysuria and frequency.  Musculoskeletal:  Negative for back pain, joint pain and neck pain.  Skin:  Negative for itching and rash.  Neurological:  Negative for dizziness, weakness and headaches.  Endo/Heme/Allergies:  Does not bruise/bleed easily.  Psychiatric/Behavioral:  Negative for depression, substance abuse and suicidal ideas.    The following portions of the patient's history were reviewed and updated as appropriate: allergies, current medications, past family history, past medical history, past social history, past surgical history and problem list. Problem list updated.  Objective:   Vitals:   08/01/21 1301  BP: 129/77  Weight: 156 lb 3.2 oz (70.9 kg)  Height: 5\' 4"  (1.626 m)    Physical Exam Vitals and nursing note reviewed.  Constitutional:      Appearance: Normal appearance.  HENT:     Head: Normocephalic and atraumatic.     Mouth/Throat:     Mouth: Mucous membranes are moist.     Dentition: Abnormal dentition. Dental caries present.     Pharynx: No oropharyngeal exudate or posterior oropharyngeal erythema.  Eyes:     General: No scleral icterus. Cardiovascular:     Rate and Rhythm: Normal rate.     Pulses: Normal pulses.  Pulmonary:     Effort: Pulmonary effort is normal.  Abdominal:     General: Abdomen is flat. Bowel sounds are normal.  Palpations: Abdomen is soft.  Musculoskeletal:        General: Normal range of motion.     Cervical back: Normal range of motion and neck supple.  Skin:    General: Skin is warm and dry.  Neurological:     General: No focal deficit present.     Mental Status: She is alert.      Assessment and Plan:  Claudia Guerra is a 35 y.o. female G1P1001 presenting to the [facility] Department for an yearly wellness and contraception visit   1. Routine general medical examination at a health care  facility Well woman exam Pap due 08/01/2023 CBE today - L breast mass dx in 2021   2. Family planning services Contraception counseling: Reviewed all forms of birth control options in the tiered based approach. available including abstinence; over the counter/barrier methods; hormonal contraceptive medication including pill, patch, ring, injection,contraceptive implant, ECP; hormonal and nonhormonal IUDs; permanent sterilization options including vasectomy and the various tubal sterilization modalities. Risks, benefits, and typical effectiveness rates were reviewed.  Questions were answered.  Written information was also given to the patient to review.  Patient desires to continue [facility], this was prescribed for patient. She will follow up in  1 yr  for surveillance.  She was told to call with any further questions, or with any concerns about this method of contraception.  Emphasized use of condoms 100% of the time for STI prevention.  Patient was not offered ECP based on current compliance of method.   3. Surveillance for birth control, oral contraceptives Pt wants to continue pills, Consent signed.   - norgestimate-ethinyl estradiol (ORTHO-CYCLEN) 0.25-35 MG-MCG tablet; Take 1 tablet by mouth daily.  Dispense: 28 tablet; Refill: 12  4. Mass of upper outer quadrant of left breast Pt is being followed for left breat mass, was dx in 2021, mass has gotten smaller since last evaluation.  ~2 x 3 cm   has appointment scheduled for follow up mammogram on 08/09/2021.       No follow-ups on file.  Future Appointments  Date Time Provider [place]  08/09/2021  8:00 AM [facility] CLINIC [facility] None   A. Eugenio Hoes, RN  used for some pt clarity, pt otherwise spoke english and responded in actions to physical demands of exam in [place].    Junious Dresser, FNP

## 2021-08-03 NOTE — Progress Notes (Signed)
Patient pre-screened for BCCCP eligibility due to COVID 19 precautions. Two patient identifiers used for verification that I was speaking to correct patient.  Patient returning for 6 month follow-up and annual ultrasound left  breast mass for stability. Patient to Present to call  [facility] today or tomorrowto schedule BCCCP ultrasound at time convenient for her.  Orders are in.

## 2021-08-09 ENCOUNTER — Other Ambulatory Visit: Payer: Self-pay

## 2021-08-09 ENCOUNTER — Ambulatory Visit: Payer: Self-pay | Attending: Oncology

## 2021-08-09 DIAGNOSIS — N63 Unspecified lump in unspecified breast: Secondary | ICD-10-CM

## 2021-08-30 ENCOUNTER — Ambulatory Visit
Admission: RE | Admit: 2021-08-30 | Discharge: 2021-08-30 | Disposition: A | Discharge status: Home / Self Care | Payer: Self-pay | Source: Ambulatory Visit | Attending: Oncology | Admitting: Oncology

## 2021-08-30 ENCOUNTER — Other Ambulatory Visit: Payer: Self-pay

## 2021-08-30 DIAGNOSIS — N63 Unspecified lump in unspecified breast: Secondary | ICD-10-CM

## 2021-09-14 ENCOUNTER — Encounter: Payer: Self-pay | Admitting: *Deleted

## 2021-09-14 NOTE — Progress Notes (Signed)
Mailed patient a letter to inform her of the need to return in 1 year for her next diagnostic mammogram.  If no insurance she is to contact Jeanella Anton with BCCCP to assist with scheduling her next BCCCP appointment and mammogram.  Will transition care to Etheleen Sia, RN with BCCCP.  Will cc this note to her.

## 2022-05-02 ENCOUNTER — Ambulatory Visit (LOCAL_COMMUNITY_HEALTH_CENTER): Payer: Self-pay

## 2022-05-02 VITALS — BP 133/74 | Ht 64.0 in | Wt 164.0 lb

## 2022-05-02 DIAGNOSIS — Z3041 Encounter for surveillance of contraceptive pills: Secondary | ICD-10-CM

## 2022-05-02 DIAGNOSIS — Z3009 Encounter for other general counseling and advice on contraception: Secondary | ICD-10-CM

## 2022-05-02 MED ORDER — NORGESTIMATE-ETH ESTRADIOL 0.25-35 MG-MCG PO TABS: 1.0000 | ORAL_TABLET | Freq: Every day | ORAL | 0 refills | Status: DC

## 2022-05-02 NOTE — Progress Notes (Signed)
In nurse clinic for ocp refill (Ortho cyclen). Denies missing any pills and takes approx same time daily. RN counseled pt about taking same time each day. Pt explains she is on last row of last pack. Per chart, pt was dispensed 12 packs ortho cyclen on 08/01/2021 per order Hilario Quarry, FNP. Pt then explains she was given option of taking ocp as "continuous dosing" which she has been doing. Order on 08/01/21 was not written for continuous dosing. Has menses every 4 months.   Consult Dr Lubertha Sayres who gives ok for pt to continue with continuous dosing of ocp and gives verbal order for Ortho cyclen #4 packs today to last until pt's return PE.   The patient was dispensed Ortho cyclen # 4 packs  today. I provided counseling today regarding the medication. We discussed the medication, the side effects and when to call clinic. Advised to schedule return appt when opens last pack of ocp. Patient given the opportunity to ask questions. Questions answered.  Annual PE due 08/02/22. Lang line # [phone] served as interpreter today. Josie Saunders, RN

## 2022-05-12 IMAGING — US US BREAST*L* LIMITED INC AXILLA
1 series · 5 of 5 positions shown · non-contrast
Comparison: Previous exam(s).

CLINICAL DATA: Patient presents for a diagnostic left breast
ultrasound to follow-up a probable benign mass over the 2 o'clock
position. Patient states this palpable mass feel slightly smaller
compared to 1 year ago. No family history breast cancer.

EXAM:
ULTRASOUND OF THE LEFT BREAST

[Series 1: us breast*left* limited inc axilla · 0.06mm/px · 5 of 5 slices shown]
[im 1/5]
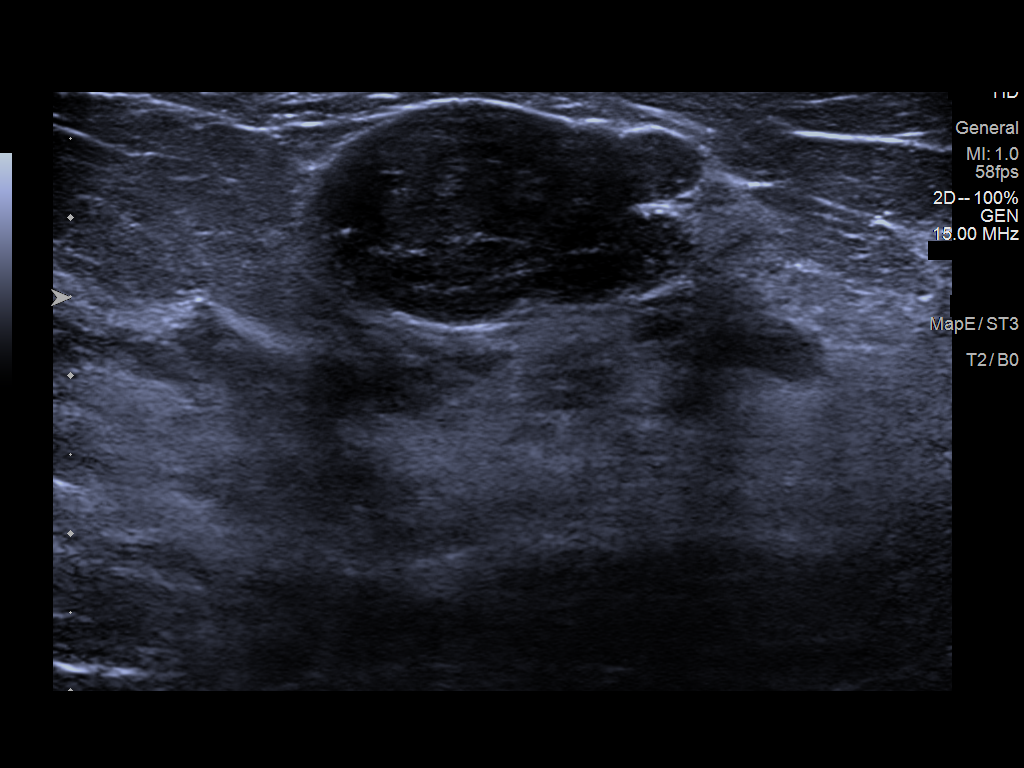
[im 2/5]
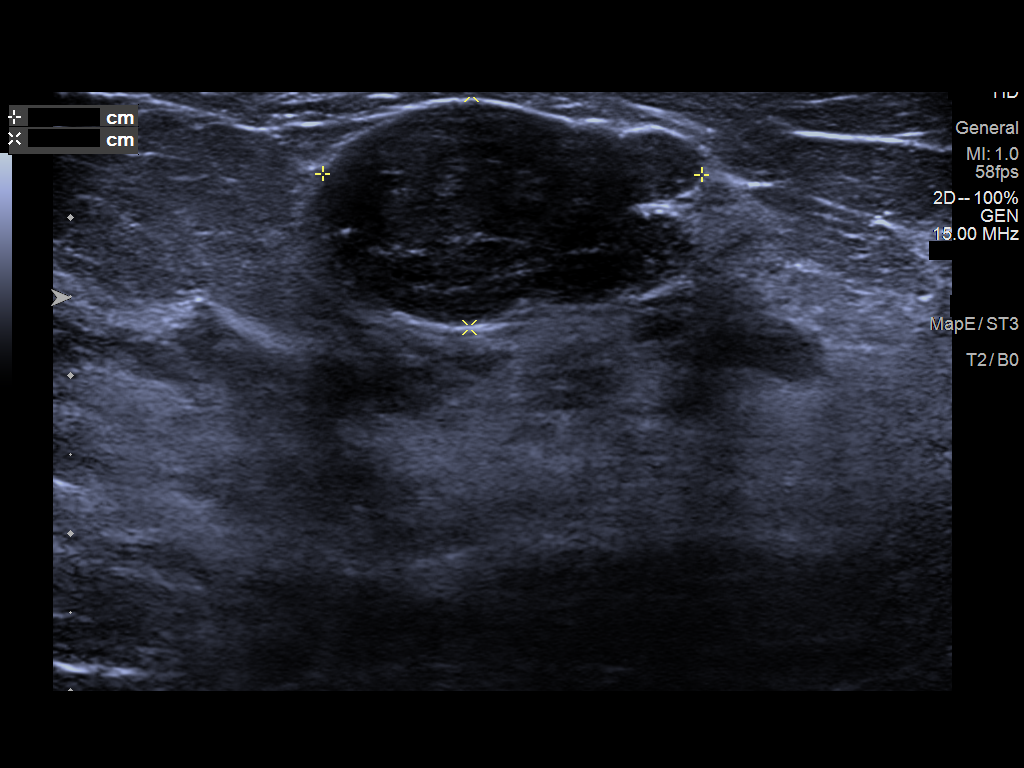
[im 3/5]
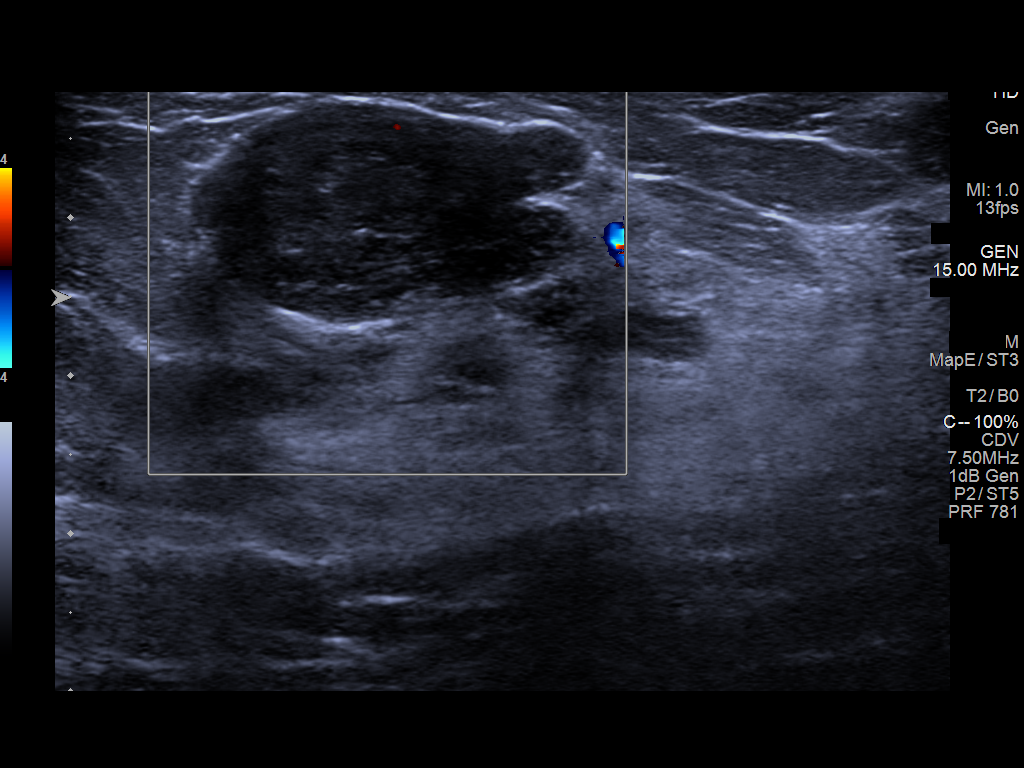
[im 4/5]
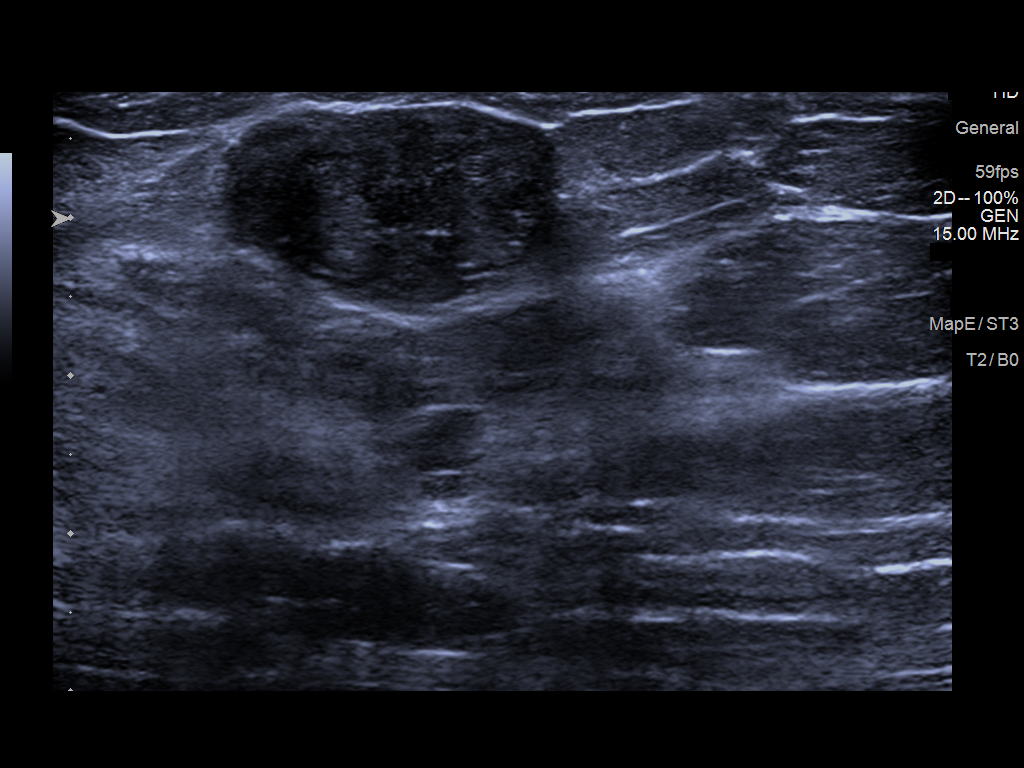
[im 5/5]
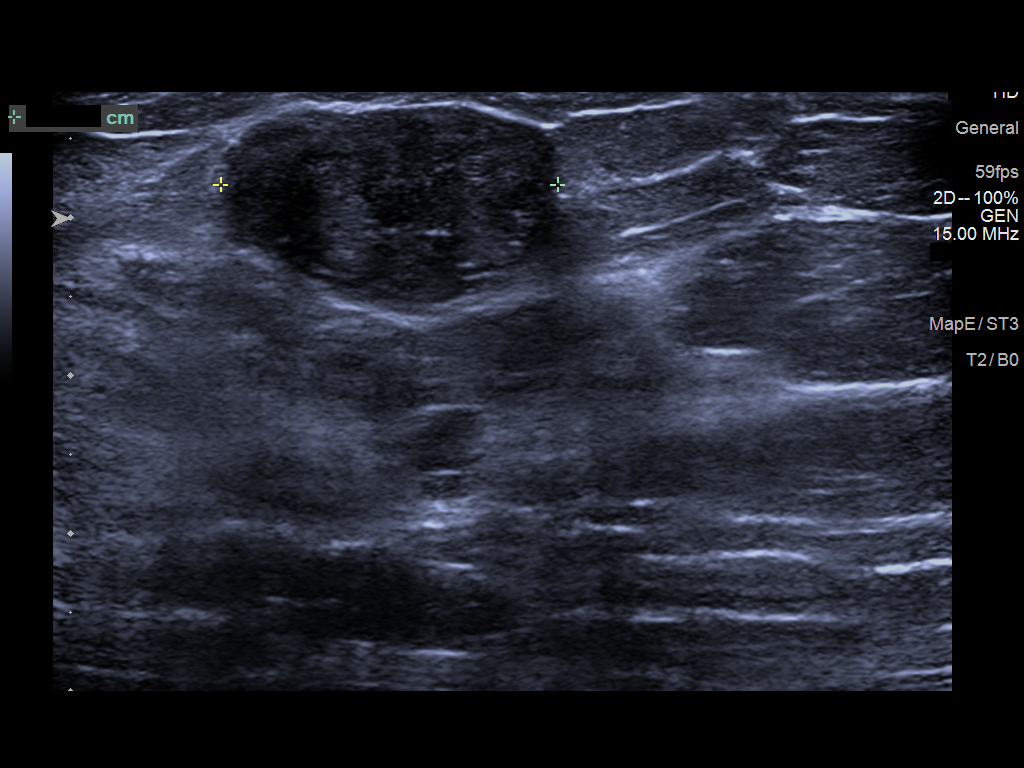

[5 of 5 positions shown; findings below may reference images not displayed]

FINDINGS: Targeted ultrasound is performed, showing continued evidence of an
oval circumscribed hypoechoic mass over the 2 o'clock position of
the left breast 3 cm from the nipple. This mass measures 1.5 x 2.1 x
2.4 cm (initially 1.4 x 1.9 x 2.6 cm). Allowing for differences in
scan plane measurement, there is no significant change.
IMPRESSION: No significant change in a 2.4 cm mass over the 2 o'clock position
of the left breast likely representing a fibroadenoma.

RECOMMENDATION:
Recommend an additional follow-up diagnostic left breast ultrasound
in 1 year to document 2 years of stability of this probable benign
finding. Patient was instructed to continue monthly self-breast
examination and to return sooner if significant increase in size.

I have discussed the findings and recommendations with the patient.
If applicable, a reminder letter will be sent to the patient
regarding the next appointment.

BI-RADS CATEGORY  3: Probably benign.

## 2022-08-15 ENCOUNTER — Ambulatory Visit: Payer: Self-pay

## 2022-08-15 ENCOUNTER — Ambulatory Visit: Payer: Self-pay | Admitting: Family Medicine

## 2022-08-15 ENCOUNTER — Encounter: Payer: Self-pay | Admitting: Family Medicine

## 2022-08-15 VITALS — BP 128/77 | Ht 67.0 in | Wt 162.6 lb

## 2022-08-15 DIAGNOSIS — Z0000 Encounter for general adult medical examination without abnormal findings: Secondary | ICD-10-CM

## 2022-08-15 DIAGNOSIS — D242 Benign neoplasm of left breast: Secondary | ICD-10-CM

## 2022-08-15 DIAGNOSIS — Z3009 Encounter for other general counseling and advice on contraception: Secondary | ICD-10-CM

## 2022-08-15 DIAGNOSIS — Z309 Encounter for contraceptive management, unspecified: Secondary | ICD-10-CM

## 2022-08-15 DIAGNOSIS — R7611 Nonspecific reaction to tuberculin skin test without active tuberculosis: Secondary | ICD-10-CM

## 2022-08-15 MED ORDER — NORGESTIMATE-ETH ESTRADIOL 0.25-35 MG-MCG PO TABS: 1.0000 | ORAL_TABLET | Freq: Every day | ORAL | 15 refills | Status: DC

## 2022-08-15 NOTE — Progress Notes (Signed)
[facility] Number: [phone]  Family Planning Visit- Repeat Yearly Visit  Subjective:  Claudia Guerra is a 36 y.o. G1P1001  being seen today for an annual wellness visit and to discuss contraception options.   The patient is currently using Oral Contraceptive for pregnancy prevention. Patient does not want a pregnancy in the next year.    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  No chief complaint on file.   Patient reports to clinic for PE with oral contraceptive refill.     See flowsheet for other program required questions.   Body mass index is 25.47 kg/m. - Patient is eligible for diabetes screening based on BMI and age >82?  not applicable UM3N ordered? not applicable  Patient reports 1 of partners in last year. Desires STI screening?  No - declined   Has patient been screened once for HCV in the past?  No  No results found for: "HCVAB"  Does the patient have current of drug use, have a partner with drug use, and/or has been incarcerated since last result? No  If yes-- Screen for HCV through [facility]   Does the patient meet criteria for HBV testing? No  Criteria:  -Household, sexual or needle sharing contact with HBV -History of drug use -HIV positive -Those with known Hep C   Health Maintenance Due  Topic Date Due   Hepatitis C Screening  Never done   COVID-19 Vaccine (3 - Pfizer risk series) 01/09/2020   INFLUENZA VACCINE  Never done    Review of Systems  Constitutional: Negative.   Respiratory: Negative.    Gastrointestinal: Negative.   Genitourinary: Negative.   Skin: Negative.    The following portions of the patient's history were reviewed and updated as appropriate: allergies, current medications, past  family history, past medical history, past social history, past surgical history and problem list. Problem list updated.  Objective:   Vitals:   08/15/22 1003  BP: 128/77  Weight: 162 lb 9.6 oz (73.8 kg)  Height: '5\' 7"'$  (1.702 m)    Physical Exam Vitals and nursing note reviewed.  Constitutional:      Appearance: Normal appearance.  HENT:     Head: Normocephalic and atraumatic.     Mouth/Throat:     Mouth: Mucous membranes are moist.     Pharynx: Oropharynx is clear. No oropharyngeal exudate or posterior oropharyngeal erythema.  Pulmonary:     Effort: Pulmonary effort is normal.  Chest:  Breasts:    Tanner Score is 5.     Right: No swelling, mass, nipple discharge, skin change or tenderness.     Left: Mass present. No swelling, skin change or tenderness.       Comments: Left breast fibroadenoma present and palpable. Abdominal:     General: Abdomen is flat.     Palpations: There is no mass.     Tenderness: There is no abdominal tenderness. There is no rebound.  Genitourinary:    Comments: Patient declined genital exam. Lymphadenopathy:     Head:     Right side of head: No preauricular or posterior auricular adenopathy.     Left side of head: No preauricular or posterior auricular adenopathy.     Cervical: No cervical adenopathy.     Upper Body:  Right upper body: No supraclavicular, axillary or epitrochlear adenopathy.     Left upper body: No supraclavicular, axillary or epitrochlear adenopathy.  Skin:    General: Skin is warm and dry.     Findings: No rash.  Neurological:     Mental Status: She is alert and oriented to person, place, and time.       Assessment and Plan:  Claudia Guerra is a 36 y.o. female [place] presenting to the [facility] Department for an yearly wellness and contraception visit   Contraception counseling: Reviewed options based on patient desire and reproductive life plan. Patient is interested in Oral  Contraceptive. This was provided to the patient today.   Risks, benefits, and typical effectiveness rates were reviewed.  Questions were answered.  Written information was also given to the patient to review.    The patient will follow up in  1 years for surveillance.  The patient was told to call with any further questions, or with any concerns about this method of contraception.  Emphasized use of condoms 100% of the time for STI prevention.  Patient was assessed for need for ECP. ECP not indicated- patient is covered by OCPs   1. Well woman exam (no gynecological exam) Last Pap test 2019, not due until 07/2023. CBE today  2. Family planning Has been taking Ortho-Cyclen Continuous. Will write order for this today for 1 year.   3. Fibroadenoma of breast, left Patient was evaluated in 08/2021 by [facility]- this mass was noted to be likely benign. Palpated on exam. Patient states there have been no changes. They recommended f/u mammogram in 09/2022 with BCCCP program. Pamphlet given with number for [place] [facility]. Encouraged to call and make appointment for January 2024.   4. Positive PPD  Earliest note with the mention of positive PPD from 2014. Patient was pregnant at this time, chest xray was negative, and the note states "patient will do postpartum treatment" Patient states they are unsure whether they had treatment at this time. Patient accepts coordination with TB coordinator. Will refer to our TB coordinator to determine what f/u is needed.   RTC with concerns or problems.   Total time spent 40 minutes.   Sharlet Salina, [place]

## 2022-08-15 NOTE — Progress Notes (Signed)
Pt. seen for annual physical,exam, and BC management. Pt declined STI screening. Seen by FNP Claudia Guerra. Condoms given.  Family planning packet given and contents reviewed. OCP dispensed with instructions and education provided.

## 2022-08-17 ENCOUNTER — Telehealth: Payer: Self-pay

## 2022-08-17 NOTE — Telephone Encounter (Signed)
Call to patient with Lang.Line interpreter [phone]. Two attempts made by RN to reach patient to discuss past PPD results and treatment. No answer. LMOM with call back number.   PPD 01/06/13 74m  CXR  01/22/2013 neg per nurse note  Per Entry Note on 08/18/2014 Problem comment: "'@13mm'$  NCEDSS#[id number] Declined LTBI" No documentation noted     08/17/2022  Call attempt with Language Line interpreter. No answer. LMOM and phone number to contact TB coordinator. F/U'd with Dr NLolita Lenzand VO to get QFT if patient returns call. HArt Buff FNP informed about case.  PServando Salina RN

## 2023-07-18 ENCOUNTER — Encounter: Payer: Self-pay | Admitting: Advanced Practice Midwife

## 2023-07-18 ENCOUNTER — Ambulatory Visit (LOCAL_COMMUNITY_HEALTH_CENTER): Payer: Self-pay | Admitting: Advanced Practice Midwife

## 2023-07-18 VITALS — BP 132/83 | HR 88 | Wt 172.8 lb

## 2023-07-18 DIAGNOSIS — Z3041 Encounter for surveillance of contraceptive pills: Secondary | ICD-10-CM

## 2023-07-18 DIAGNOSIS — Z720 Tobacco use: Secondary | ICD-10-CM

## 2023-07-18 DIAGNOSIS — Z3009 Encounter for other general counseling and advice on contraception: Secondary | ICD-10-CM

## 2023-07-18 DIAGNOSIS — D242 Benign neoplasm of left breast: Secondary | ICD-10-CM

## 2023-07-18 LAB — WET PREP FOR TRICH, YEAST, CLUE
Trichomonas Exam: NEGATIVE
Yeast Exam: NEGATIVE

## 2023-07-18 LAB — HM HIV SCREENING LAB: HM HIV Screening: NEGATIVE

## 2023-07-18 LAB — HM HEPATITIS C SCREENING LAB: HM Hepatitis Screen: NEGATIVE

## 2023-07-18 MED ORDER — NORGESTIMATE-ETH ESTRADIOL 0.25-35 MG-MCG PO TABS: 1.0000 | ORAL_TABLET | Freq: Every day | ORAL | 13 refills | Status: AC

## 2023-07-18 NOTE — Progress Notes (Signed)
[facility] [address] Main Number: [phone]   Family Planning Visit- Initial Visit  Subjective:  Claudia Guerra is a 37 y.o. SHF exsmoker  [id number]  (10) being seen today for an initial annual visit and to discuss reproductive life planning.  The patient is currently using Oral Contraceptive for pregnancy prevention. Patient reports   does not want a pregnancy in the next year.     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  Chief Complaint  Patient presents with   Annual Exam    And ocp     Patient reports here for physical and pap. Last E 08/15/22. Last pap 07/31/2018 neg HPV neg. Happy with ocp's and desires more; last ocp taken last night. 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 Last sex 07/14/23 without condom; with current partner x 10 years. LMP 06/28/23. Last cig 4 years ago. Last cigar 4 years ago. Last ETOH 07/13/23 (3 beers). Last dental exam 10 years ago. Highest grade completed 12th. Last mammogram 2022 and was told by [facility] to have f/u mammogram 09/2022 but pt did not call to schedule.   Patient denies vaping, MJ  Body mass index is 27.06 kg/m. - Patient is eligible for diabetes screening based on BMI> 25 and age >35?  yes HA1C ordered? yes  Patient reports 1  partner/s in last year. Desires STI screening?  Yes  Has patient been screened once for HCV in the past?  Yes  No results found for: "HCVAB"  Does the patient have current drug use (including MJ), have a partner with drug use, and/or has been incarcerated since last result? No  If yes-- Screen for HCV through [facility]   Does the patient meet criteria for HBV testing? Yes  Criteria:  -Household, sexual or needle sharing contact with HBV -History of drug use -HIV  positive -Those with known Hep C   Health Maintenance Due  Topic Date Due   Hepatitis C Screening  Never done   COVID-19 Vaccine (3 - Pfizer risk series) 01/09/2020   Cervical Cancer Screening (HPV/Pap Cotest)  07/31/2021   DTaP/Tdap/Td (2 - Td or Tdap) 03/06/2023   INFLUENZA VACCINE  Never done    Review of Systems  Constitutional:  Positive for weight loss (8 lb wt gain in last year, not exercising).    The following portions of the patient's history were reviewed and updated as appropriate: allergies, current medications, past family history, past medical history, past social history, past surgical history and problem list. Problem list updated.   See flowsheet for other program required questions.  Objective:   Vitals:   07/18/23 1554  BP: 132/83  Pulse: 88  Weight: 172 lb 12.8 oz (78.4 kg)    Physical Exam Constitutional:      Appearance: Normal appearance. She is normal weight.  HENT:     Head: Normocephalic and atraumatic.     Mouth/Throat:     Mouth: Mucous membranes are moist.     Comments: Last dental exam 10 years ago Eyes:     Conjunctiva/sclera: Conjunctivae normal.  Neck:     Thyroid: No thyroid mass, thyromegaly or thyroid tenderness.  Cardiovascular:     Rate and Rhythm: Normal rate and regular rhythm.  Pulmonary:     Effort: Pulmonary effort  is normal.     Breath sounds: Normal breath sounds.  Abdominal:     Palpations: Abdomen is soft.     Comments: Soft without masses or tenderness, fair tone  Genitourinary:    General: Normal vulva.     Exam position: Lithotomy position.     Pubic Area: No pubic lice.      Vagina: Vaginal discharge (white creamy leukorrhea, ph<4.5) present.     Cervix: Friability (friable to pap) present.     Uterus: Normal.      Adnexa: Right adnexa normal and left adnexa normal.     Rectum: Normal.     Comments: No evidence of nits Pap done Musculoskeletal:        General: Normal range of motion.     Cervical back:  Normal range of motion and neck supple.  Skin:    General: Skin is warm and dry.  Neurological:     Mental Status: She is alert.  Psychiatric:        Mood and Affect: Mood normal.       Assessment and Plan:  Claudia Guerra is a 37 y.o. female presenting to the [facility] Department for an initial annual wellness/contraceptive visit  Contraception counseling: Reviewed options based on patient desire and reproductive life plan. Patient is interested in Oral Contraceptive. This was provided to the patient today.  if not why not clearly documented  Risks, benefits, and typical effectiveness rates were reviewed.  Questions were answered.  Written information was also given to the patient to review.    The patient will follow up in  1 years for surveillance.  The patient was told to call with any further questions, or with any concerns about this method of contraception.  Emphasized use of condoms 100% of the time for STI prevention.  Educated on ECP and assessed for need of ECP. Patient reported not meeting criteria.  Reviewed options and patient desired No method of ECP, declined all    1. Family planning Treat wet mount per standing orders Immunization nurse consult  - Chlamydia/Gonorrhea [place] [facility] - HIV/HCV [place] [facility] - Syphilis Serology, [place] [facility] - IGP, Aptima HPV  2. Encounter for surveillance of contraceptive pills Ortho Cyclen #13 I po daily  3. Fibroadenoma of breast, left Was told by [facility] to have mammogram 09/2022--pt never called  Please give pt [facility] # to call to schedule mammogram   No follow-ups on file.  No future appointments.  Alberteen Spindle, CNM

## 2023-07-18 NOTE — Progress Notes (Signed)
Pt is here for family planning visit.  Family planning packet reviewed and given to pt.  Wet prep results reviewed, no treatment required per standing orders. Condoms given. The patient was dispensed Ortho Cyclen 10 packs today. I provided counseling today regarding the medication. We discussed the medication, the side effects and when to call clinic. Patient given the opportunity to ask questions. Questions answered.  Gaspar Garbe, RN

## 2023-07-19 LAB — HGB A1C W/O EAG: Hgb A1c MFr Bld: 5.5 % (ref 4.8–5.6)

## 2023-07-24 LAB — IGP, APTIMA HPV
HPV Aptima: NEGATIVE
PAP Smear Comment: 0

## 2024-02-26 ENCOUNTER — Ambulatory Visit: Payer: Self-pay

## 2024-02-26 VITALS — BP 122/69 | Ht 67.0 in | Wt 171.5 lb

## 2024-02-26 DIAGNOSIS — Z30011 Encounter for initial prescription of contraceptive pills: Secondary | ICD-10-CM

## 2024-02-26 DIAGNOSIS — Z3041 Encounter for surveillance of contraceptive pills: Secondary | ICD-10-CM

## 2024-02-26 DIAGNOSIS — Z3009 Encounter for other general counseling and advice on contraception: Secondary | ICD-10-CM

## 2024-02-26 MED ORDER — NORGESTIMATE-ETH ESTRADIOL 0.25-35 MG-MCG PO TABS: 1.0000 | ORAL_TABLET | Freq: Every day | ORAL | Status: AC

## 2024-02-26 NOTE — Progress Notes (Signed)
 In nurse clinic for ocp refill. Patient states she is on 3rd line of last pack of ocp.   Patient was dispensed #10 packs Orthocyclen on 07/18/2023. Order by FORBES Hillier, CNM.   Patient explains she is taking ocp continuous for 3 months and on the 4th month she is taking the entire pack so that she will have period.  Denies missing any pills and takes same time daily.   Consult J Idol, FNP and informed of patient status and that she is taking continuously for 3 months and taking entire pack for 4th month in order to have period. LMP 12/12/2023. Provider gives ok for patient to continue taking ocp in this way.   JINNY Narrow, FNP orders #9 packs ortho cyclen today to last until annual PE which is due 07/18/2024.   The patient was dispensed Ortho cyclen (Mili) #9 packs today per verbal order JINNY Narrow, FNP. Insructions reviewed.  I provided counseling today regarding the medication. We discussed the medication, the side effects and when to call clinic. Patient given the opportunity to ask questions. Questions answered.    Patient explains she has not made mammogram appt which was recommended at last annual PE. She states plans to schedule this week.   LILLETTE Roche, interpreter for today's visit. Keyana Guevara, RN
